# Patient Record
Sex: Female | Born: 1995 | Race: White | Hispanic: No | Smoking: Never smoker
Health system: Southern US, Community
[De-identification: ages and names within clinical notes are randomized; demographics above are authoritative.]

## PROBLEM LIST (undated history)

## (undated) DIAGNOSIS — IMO0001 Reserved for inherently not codable concepts without codable children: Secondary | ICD-10-CM

## (undated) DIAGNOSIS — M26609 Unspecified temporomandibular joint disorder, unspecified side: Secondary | ICD-10-CM

## (undated) DIAGNOSIS — K219 Gastro-esophageal reflux disease without esophagitis: Secondary | ICD-10-CM

## (undated) DIAGNOSIS — Z889 Allergy status to unspecified drugs, medicaments and biological substances status: Secondary | ICD-10-CM

## (undated) DIAGNOSIS — M419 Scoliosis, unspecified: Secondary | ICD-10-CM

## (undated) DIAGNOSIS — R51 Headache: Secondary | ICD-10-CM

## (undated) DIAGNOSIS — M940 Chondrocostal junction syndrome [Tietze]: Secondary | ICD-10-CM

## (undated) DIAGNOSIS — R519 Headache, unspecified: Secondary | ICD-10-CM

## (undated) HISTORY — DX: Headache: R51

## (undated) HISTORY — DX: Unspecified temporomandibular joint disorder, unspecified side: M26.609

## (undated) HISTORY — DX: Scoliosis, unspecified: M41.9

## (undated) HISTORY — DX: Chondrocostal junction syndrome (tietze): M94.0

## (undated) HISTORY — DX: Headache, unspecified: R51.9

---
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
## 2015-02-01 ENCOUNTER — Emergency Department (HOSPITAL_COMMUNITY)
Admission: EM | Admit: 2015-02-01 | Discharge: 2015-02-01 | Disposition: A | Discharge status: Home / Self Care | Payer: BLUE CROSS/BLUE SHIELD | Attending: Emergency Medicine | Admitting: Emergency Medicine

## 2015-02-01 ENCOUNTER — Encounter (HOSPITAL_COMMUNITY): Payer: Self-pay

## 2015-02-01 DIAGNOSIS — H11421 Conjunctival edema, right eye: Secondary | ICD-10-CM | POA: Diagnosis not present

## 2015-02-01 DIAGNOSIS — H11432 Conjunctival hyperemia, left eye: Secondary | ICD-10-CM

## 2015-02-01 DIAGNOSIS — M419 Scoliosis, unspecified: Secondary | ICD-10-CM | POA: Diagnosis not present

## 2015-02-01 DIAGNOSIS — H578 Other specified disorders of eye and adnexa: Secondary | ICD-10-CM | POA: Diagnosis present

## 2015-02-01 DIAGNOSIS — K219 Gastro-esophageal reflux disease without esophagitis: Secondary | ICD-10-CM | POA: Diagnosis not present

## 2015-02-01 MED ORDER — FLUORESCEIN SODIUM 1 MG OP STRP
1.0000 | ORAL_STRIP | Freq: Once | OPHTHALMIC | Status: AC
  Administered 2015-02-01: 1 via OPHTHALMIC

## 2015-02-01 MED ORDER — TETRACAINE HCL 0.5 % OP SOLN
1.0000 [drp] | Freq: Once | OPHTHALMIC | Status: AC
  Administered 2015-02-01: 1 [drp] via OPHTHALMIC
  Filled 2015-02-01: qty 2

## 2015-02-01 NOTE — ED Provider Notes (Signed)
CSN: [id number]     Arrival date & time 02/01/15  0134 History   First MD Initiated Contact with Patient 02/01/15 0144     Chief Complaint  Patient presents with  . Foreign Body in Eye    (Consider location/radiation/quality/duration/timing/severity/associated sxs/prior Treatment) HPI Comments: Patient presents with complaint of foreign body in her left eye. Patient states that she was taking out her contact lens earlier tonight and states that they contact became stuck in her eye. She repeatedly tried to remove it. She feels a ridge below the left iris and a 'bubble' when she pinches her eye as if she is trying to get the contact lens out. Patient states that her vision is similar to when she does not have a contact lens in that eye. No black spots or flashes. Her eyes irritated but there is no discharge. No pain with movement of either. No other foreign bodies into eye or other injuries reported. No treatments prior to arrival. Onset of symptoms acute. Course is constant. Nothing makes symptoms better or worse.  The history is provided by the patient.    Past Medical History  Diagnosis Date  . Multiple allergies   . Reflux   . Costochondritis   . Persistent headaches   . Scoliosis   . TMJ (temporomandibular joint disorder)    No past surgical history on file. Family History  Problem Relation Age of Onset  . Diabetes Paternal Grandmother   . Asthma Father   . Hypertension Father   . Kidney Stones Father   . Thyroid disease Mother    History  Substance Use Topics  . Smoking status: Never Smoker   . Smokeless tobacco: Never Used  . Alcohol Use: No   OB History    Gravida Para Term Preterm AB TAB SAB Ectopic Multiple Living       Review of Systems  Constitutional: Negative for fever.  HENT: Negative for congestion, ear pain and rhinorrhea.   Eyes: Positive for redness. Negative for photophobia, pain, discharge, itching and visual disturbance.   Gastrointestinal: Negative for nausea and vomiting.  Skin: Negative for rash.  Neurological: Negative for headaches.      Allergies  Erythromycin  Home Medications   Prior to Admission medications   Medication Sig Start Date End Date Taking? Authorizing Provider  omeprazole (PRILOSEC) 40 MG capsule Take 40 mg by mouth daily as needed (heart burn).    Yes Historical Provider, MD  sertraline (ZOLOFT) 100 MG tablet Take 100 mg by mouth daily.  11/04/14 11/04/15 Yes Historical Provider, MD  medroxyPROGESTERone (DEPO-PROVERA) 150 MG/ML injection Inject 1 mL (150 mg total) into the muscle once. Patient not taking: Reported on 02/01/2015 08/29/12   Silverio Lay, MD  traMADol (ULTRAM) 50 MG tablet Take 1 tablet (50 mg total) by mouth every 6 (six) hours as needed for pain. Patient not taking: Reported on 02/01/2015 06/28/12   Tia L Oliveri, PA-C   BP 119/78 mmHg  Pulse 87  Temp(Src) 97.8 F (36.6 C) (Oral)  Resp 18  Ht  (1.753 m)  Wt 116 lb (52.617 kg)  BMI 17.12 kg/m2  SpO2 100%   Physical Exam  Constitutional: She appears well-developed and well-nourished.  HENT:  Head: Normocephalic and atraumatic.  Right Ear: Tympanic membrane, external ear and ear canal normal.  Left Ear: Tympanic membrane, external ear and ear canal normal.  Nose: Nose normal. No mucosal edema.  Mouth/Throat: Uvula is  midline, oropharynx is clear and moist and mucous membranes are normal.  Eyes: EOM and lids are normal. Pupils are equal, round, and reactive to light. Lids are everted and swept, no foreign bodies found. Right eye exhibits no chemosis, no discharge, no exudate and no hordeolum. No foreign body present in the right eye. Left eye exhibits chemosis (mild, inferior to iris). Left eye exhibits no discharge, no exudate and no hordeolum. No foreign body present in the left eye. Right conjunctiva is not injected. Right conjunctiva has no hemorrhage. Left conjunctiva is injected (inferior sclera). Left  conjunctiva has no hemorrhage.  Neck: Normal range of motion. Neck supple.  Pulmonary/Chest: No respiratory distress.  Neurological: She is alert.  Skin: Skin is warm and dry.  Psychiatric: She has a normal mood and affect.  Nursing note and vitals reviewed.   ED Course  Procedures (including critical care time) Labs Review Labs Reviewed - No data to display  Imaging Review No results found.   EKG Interpretation None       1:50 AM Patient seen and examined. Work-up initiated. Medications ordered.   Vital signs reviewed and are as follows: BP 119/78 mmHg  Pulse 87  Temp(Src) 97.8 F (36.6 C) (Oral)  Resp 18  Ht 5\' 9"  (1.753 m)  Wt 116 lb (52.617 kg)  BMI 17.12 kg/m2  SpO2 100%  Two drops of tetracaine instilled into affected eye.   Fluorescein strip applied to affected eye. Wood's lamp used to assess for corneal abrasion. No corneal abrasion identified. No foreign bodies noted. No visible hyphema.   Patient tolerated procedure well without immediate complication.     Visual Acuity  Right Eye Distance: 20/20 Left Eye Distance: 20/70 Bilateral Distance: 20/15  Right Eye Near:   Left Eye Near:    Bilateral Near:     Note that visual acuity was performed with right contact in the eye in the left contact out of eye.   Ophthalmology referral given in case if no improvement in the next several days. Patient encouraged to return with change in vision, eye pain or drainage, fever, swelling around the eye. She verbalizes understanding and agrees with plan.   MDM   Final diagnoses:  Conjunctival injection, left   Patient with eye irritation after repeatedly trying to remove the contact lens in her left eye. I believe that the contact was probably dislodged and patient irritated the eye through repeatedly trying to remove it. No foreign bodies noted at this time. Lids everted and no contact lens found. No surrounding erythema, swelling, vision changes/loss suspicious  for orbital or periorbital cellulitis. No signs of iritis. No signs of glaucoma. No symptoms of retinal detachment. No ophthalmologic emergency suspected. Outpatient referral given in case of no improvement.     Renne CriglerJoshua Gurtej Noyola, PA-C 02/01/15 [id number]  Paula LibraJohn Molpus, MD 02/01/15 [phone]

## 2015-02-01 NOTE — ED Notes (Signed)
Pt presents with c/o contact stuck in her left eye since Friday night. Irritation to that eye, reports blurred vision as well.

## 2015-02-01 NOTE — Discharge Instructions (Signed)
Please read and follow all provided instructions.  Your diagnoses today include:  1. Conjunctival injection, left    Tests performed today include:  Visual acuity testing to check your vision  Fluorescein dye examination to look for scratches on your eye  Vital signs. See below for your results today.   Medications prescribed:   None  Take any prescribed medications only as directed.  Home care instructions:  Follow any educational materials contained in this packet. If you wear contact lenses, do not use them until your eye caregiver approves. See your caregiver or eye specialist as suggested for followup.   If you have an eye infection, wash your hands often as this is very contagious and is easily spread from person to person.   Follow-up instructions: Please follow-up with your primary care doctor OR the opthalmologist listed in the next 2-3 days for further evaluation of your symptoms.  Return instructions:   Please return to the Emergency Department if you experience worsening symptoms.   Please return immediately if you develop severe pain, pus drainage, new change in vision, or fever.  Please return if you have any other emergent concerns.  Additional Information:  Your vital signs today were: BP 119/78 mmHg   Pulse 87   Temp(Src) 97.8 F (36.6 C) (Oral)   Resp 18   Ht 5\' 9"  (1.753 m)   Wt 116 lb (52.617 kg)   BMI 17.12 kg/m2   SpO2 100%   LMP 01/24/2015 (Approximate) If your blood pressure (BP) was elevated above 135/85 this visit, please have this repeated by your doctor within one month. ---------------

## 2015-03-15 ENCOUNTER — Emergency Department (HOSPITAL_COMMUNITY)
Admission: EM | Admit: 2015-03-15 | Discharge: 2015-03-15 | Disposition: A | Discharge status: Home / Self Care | Payer: BLUE CROSS/BLUE SHIELD | Attending: Emergency Medicine | Admitting: Emergency Medicine

## 2015-03-15 ENCOUNTER — Encounter (HOSPITAL_COMMUNITY): Payer: Self-pay | Admitting: Emergency Medicine

## 2015-03-15 DIAGNOSIS — J0191 Acute recurrent sinusitis, unspecified: Secondary | ICD-10-CM | POA: Diagnosis not present

## 2015-03-15 DIAGNOSIS — J302 Other seasonal allergic rhinitis: Secondary | ICD-10-CM | POA: Diagnosis not present

## 2015-03-15 DIAGNOSIS — K219 Gastro-esophageal reflux disease without esophagitis: Secondary | ICD-10-CM | POA: Insufficient documentation

## 2015-03-15 DIAGNOSIS — L03116 Cellulitis of left lower limb: Secondary | ICD-10-CM | POA: Diagnosis not present

## 2015-03-15 DIAGNOSIS — M419 Scoliosis, unspecified: Secondary | ICD-10-CM | POA: Diagnosis not present

## 2015-03-15 DIAGNOSIS — R21 Rash and other nonspecific skin eruption: Secondary | ICD-10-CM | POA: Diagnosis present

## 2015-03-15 MED ORDER — HYDROCODONE-ACETAMINOPHEN 5-325 MG PO TABS: 1.0000 | ORAL_TABLET | Freq: Four times a day (QID) | ORAL | Status: AC | PRN

## 2015-03-15 MED ORDER — CEPHALEXIN 500 MG PO CAPS: ORAL_CAPSULE | ORAL | Status: DC

## 2015-03-15 MED ORDER — SULFAMETHOXAZOLE-TRIMETHOPRIM 800-160 MG PO TABS: 1.0000 | ORAL_TABLET | Freq: Two times a day (BID) | ORAL | Status: DC

## 2015-03-15 MED ORDER — NAPROXEN 500 MG PO TABS: 500.0000 mg | ORAL_TABLET | Freq: Two times a day (BID) | ORAL | Status: AC | PRN

## 2015-03-15 NOTE — ED Notes (Signed)
Pt states 1 month ago began with an itching rash to lower legs, states they occasionally look like small pimples. States she believes it could be stress related. Now has an area to the back of the left calf that is red with central head to area. Complaining of sinus congestion with yellow mucus x 2 days as well with occasional fever/hot flashes. Took no meds prior to coming in today, temp 98.6

## 2015-03-15 NOTE — ED Provider Notes (Signed)
CSN: [id number]     Arrival date & time 03/15/15  1458 History  This chart was scribed for non-physician provider Kort Stettler Camprubi- Soms, PA-C, working with Marily Memos, MD by Phillis Haggis, ED Scribe. This patient was seen in room WTR8/WTR8 and patient care was started at 3:10 PM.    Chief Complaint  Patient presents with  . Rash  . Sinus Problem   Patient is a 19 y.o. female presenting with abscess. The history is provided by the patient. No language interpreter was used.  Abscess Location:  Leg Leg abscess location:  L lower leg Abscess quality: induration, painful, redness and warmth   Abscess quality: not draining and no fluctuance   Red streaking: no   Duration:  3 days Progression:  Unchanged Pain details:    Quality:  Throbbing   Severity:  Moderate   Duration:  3 days   Timing:  Constant   Progression:  Unchanged Chronicity:  New Context: skin injury (rash)   Relieved by:  None tried Exacerbated by: ambulation. Ineffective treatments:  None tried Associated symptoms: fever (Tmax 100)   Associated symptoms: no headaches, no nausea and no vomiting    HPI Comments: Arnelle Nale is a 19 y.o. female with a PMHx of sinus infections, grass and mold allergies, and costochondritis, who presents to the ED with multiple complaints. Her primary complaint is an indurated, warm, red, swollen area on the posterior L calf that began 3 days ago. She states that for the last 1 month she's had a bilateral lower leg rash, left worse than right, which is itchy and when she scratches it the small areas which she describes as pimples open up. Pt reports the pain in the area on the L leg is a 8/10, constant, throbbing, nonradiating pain, worse with standing, with no treatments tried PTA. Secondarily she complaints of sinus congestion, yellow nasal drainage, intermittent fever tmax 100 F, and postnasal drip.  Denies chills, ear pain or draining, sore throat, trouble swallowing, CP, SOB, cough,  wheezing, abd pain, N/V/D/C, hematuria, dysuria, myalgias, arthralgias, back pain, neck pain, red streaking, or drainage from skin, headache, numbness, tingling, or weakness. Has been using new soaps, detergents, and deodorants to try to help the rash and has not had relief. States that she has used friends' razors occasionally and changes her razor blade heads every 2 weeks. Denies recent tick or other insect bites, recent travel, plant or animal contact, or any other exposures to produce the rash. Pt reports sensitivity to erythromycin.     Past Medical History  Diagnosis Date  . Multiple allergies   . Reflux   . Costochondritis   . Persistent headaches   . Scoliosis   . TMJ (temporomandibular joint disorder)    No past surgical history on file. Family History  Problem Relation Age of Onset  . Diabetes Paternal Grandmother   . Asthma Father   . Hypertension Father   . Kidney Stones Father   . Thyroid disease Mother    Social History  Substance Use Topics  . Smoking status: Never Smoker   . Smokeless tobacco: Never Used  . Alcohol Use: No   OB History    Gravida Para Term Preterm AB TAB SAB Ectopic Multiple Living       Review of Systems  Constitutional: Positive for fever (Tmax 100). Negative for chills.  HENT: Positive for congestion, postnasal drip, rhinorrhea (yellow) and sinus pressure. Negative for  ear discharge, ear pain, sore throat and trouble swallowing.   Respiratory: Negative for cough, shortness of breath and wheezing.   Cardiovascular: Negative for chest pain.  Gastrointestinal: Negative for nausea, vomiting, abdominal pain, diarrhea and constipation.  Genitourinary: Negative for dysuria and hematuria.  Musculoskeletal: Negative for myalgias, back pain, arthralgias and neck pain.  Skin: Positive for color change and rash.       No red streaking  Allergic/Immunologic: Positive for environmental allergies. Negative for immunocompromised  state.  Neurological: Negative for weakness, numbness and headaches.   A complete 10 system review of systems was obtained and all systems are negative except as noted in the HPI and PMH.   Allergies  Erythromycin  Home Medications   Prior to Admission medications   Medication Sig Start Date End Date Taking? Authorizing Provider  medroxyPROGESTERone (DEPO-PROVERA) 150 MG/ML injection Inject 1 mL (150 mg total) into the muscle once. Patient not taking: Reported on 02/01/2015 08/29/12   Silverio Lay, MD  omeprazole (PRILOSEC) 40 MG capsule Take 40 mg by mouth daily as needed (heart burn).     Historical Provider, MD  sertraline (ZOLOFT) 100 MG tablet Take 100 mg by mouth daily.  11/04/14 11/04/15  Historical Provider, MD  traMADol (ULTRAM) 50 MG tablet Take 1 tablet (50 mg total) by mouth every 6 (six) hours as needed for pain. Patient not taking: Reported on 02/01/2015 06/28/12   Tia L Oliveri, PA-C   BP 126/75 mmHg  Pulse 101  Temp(Src) 98.6 F (37 C) (Oral)  Resp 18  SpO2 99%  Physical Exam  Constitutional: She is oriented to person, place, and time. Vital signs are normal. She appears well-developed and well-nourished.  Non-toxic appearance. No distress.  Afebrile, nontoxic, NAD  HENT:  Head: Normocephalic and atraumatic.  Right Ear: Hearing, tympanic membrane, external ear and ear canal normal.  Left Ear: Hearing, tympanic membrane, external ear and ear canal normal.  Nose: Mucosal edema present. Right sinus exhibits maxillary sinus tenderness. Right sinus exhibits no frontal sinus tenderness. Left sinus exhibits maxillary sinus tenderness. Left sinus exhibits no frontal sinus tenderness.  Mouth/Throat: Uvula is midline, oropharynx is clear and moist and mucous membranes are normal. No trismus in the jaw. No uvula swelling.  Ears are clear bilaterally. Nose with mild nasal mucosa edema. Bilateral maxillary sinus TTP.  Oropharynx clear and moist, without uvular swelling or deviation, no  trismus or drooling, no tonsillar swelling or erythema, no exudates.   Eyes: Conjunctivae and EOM are normal. Right eye exhibits no discharge. Left eye exhibits no discharge.  Neck: Normal range of motion. Neck supple.  Cardiovascular: Normal rate and intact distal pulses.   Pulmonary/Chest: Effort normal. No respiratory distress.  Abdominal: Normal appearance. She exhibits no distension.  Musculoskeletal: Normal range of motion.  Neurological: She is alert and oriented to person, place, and time. She has normal strength. No sensory deficit.  Skin: Skin is warm, dry and intact. Rash noted. There is erythema.     Approximately 3 cm area of induration located to the posterior left calf, erythematous and warm to the touch with a central pinpoint head/opening, with mild TTP to the area, no drainage or fluctuance. No LE edema. Multiple other lesions to bilateral legs which appear like insect bites. No red streaking.   Psychiatric: She has a normal mood and affect. Her behavior is normal.  Nursing note and vitals reviewed.   ED Course  Procedures (including critical care time) DIAGNOSTIC STUDIES: Oxygen Saturation is 99% on RA,  normal by my interpretation.    COORDINATION OF CARE: 3:17 PM-Discussed treatment plan which includes anti-biotics and hydrocortisone cream, heat compresses, follow up with PCP with pt at bedside and pt agreed to plan.   Labs Review Labs Reviewed - No data to display  Imaging Review No results found.    EKG Interpretation None      MDM   Final diagnoses:  Cellulitis of left lower extremity  Acute recurrent sinusitis, unspecified location  Rash    19 y.o. female here with multiple complaints. Primary complaint is cellulitic area to posterior L leg, which resulted from a rash she's had x1 month. No recent changes in meds/etc, no exposures, no insect/tick bites. Unclear etiology of rash therefore pt will need to f/up with derm/PCP, but area on leg today  appears to be cellulitis, not yet a fluctuant abscess. Will start on abx, give pain meds, and have her f/up with PCP in 2 days for recheck. Secondary complaint is 2 days of sinus congestion and yellow nasal discharge with occasional low-grade temp. Afebrile here. Mild nasal edema and maxillary sinus tenderness. Abx should cover for sinusitis, discussed other OTC med options to help as well. Will have her f/up with PCP. I explained the diagnosis and have given explicit precautions to return to the ER including for any other new or worsening symptoms. The patient understands and accepts the medical plan as it's been dictated and I have answered their questions. Discharge instructions concerning home care and prescriptions have been given. The patient is STABLE and is discharged to home in good condition.   I personally performed the services described in this documentation, which was scribed in my presence. The recorded information has been reviewed and is accurate.  BP 126/75 mmHg  Pulse 101  Temp(Src) 98.6 F (37 C) (Oral)  Resp 18  SpO2 99%  Meds ordered this encounter  Medications  . sulfamethoxazole-trimethoprim (BACTRIM DS,SEPTRA DS) 800-160 MG per tablet    Sig: Take 1 tablet by mouth 2 (two) times daily.    Dispense:  14 tablet    Refill:  0    Order Specific Question:  Supervising Provider    Answer:  MILLER, BRIAN [[id number]]  . cephALEXin (KEFLEX) 500 MG capsule    Sig: 2 caps po bid x 7 days    Dispense:  28 capsule    Refill:  0    Order Specific Question:  Supervising Provider    Answer:  MILLER, BRIAN [[id number]]  . HYDROcodone-acetaminophen (NORCO) 5-325 MG per tablet    Sig: Take 1 tablet by mouth every 6 (six) hours as needed for severe pain.    Dispense:  6 tablet    Refill:  0    Order Specific Question:  Supervising Provider    Answer:  MILLER, BRIAN [[id number]]  . naproxen (NAPROSYN) 500 MG tablet    Sig: Take 1 tablet (500 mg total) by mouth 2 (two) times daily as needed for  mild pain, moderate pain or headache (TAKE WITH MEALS.).    Dispense:  20 tablet    Refill:  0    Order Specific Question:  Supervising Provider    Answer:  Eusebio Friendly      Tiandre Teall Camprubi-Soms, PA-C 03/15/15 1542  Marily Memos, MD 03/17/15 [phone]

## 2015-03-15 NOTE — Discharge Instructions (Signed)
Keep area on leg clean and dry. Apply warm compresses to affected area throughout the day. Take antibiotics until it is finished. Take naprosyn and norco as directed, as needed for pain but do not drive or operate machinery with pain medication use. Followup with [facility]Primary Care doctor in 2 days for wound recheck and ongoing evaluation of the rash. Monitor area for signs of infection to include, but not limited to: increasing pain, redness, drainage/pus, or swelling. Continue to stay well-hydrated. Use Mucinex for cough suppression/expectoration of mucus. Use netipot and flonase to help with nasal congestion. May consider over-the-counter Benadryl or other antihistamine to decrease secretions and for watery itchy eyes. Return to emergency department for emergent changing or worsening symptoms.    Cellulitis Cellulitis is an infection of the skin and the tissue under the skin. The infected area is usually red and tender. This happens most often in the arms and lower legs. HOME CARE   Take your antibiotic medicine as told. Finish the medicine even if you start to feel better.  Keep the infected arm or leg raised (elevated).  Put a warm cloth on the area up to 4 times per day.  Only take medicines as told by your doctor.  Keep all doctor visits as told. GET HELP IF:  You see red streaks on the skin coming from the infected area.  Your red area gets bigger or turns a dark color.  Your bone or joint under the infected area is painful after the skin heals.  Your infection comes back in the same area or different area.  You have a puffy (swollen) bump in the infected area.  You have new symptoms.  You have a fever. GET HELP RIGHT AWAY IF:   You feel very sleepy.  You throw up (vomit) or have watery poop (diarrhea).  You feel sick and have muscle aches and pains. MAKE SURE YOU:   Understand these instructions.  Will watch your condition.  Will get help right  away if you are not doing well or get worse. Document Released: 01/04/2008 Document Revised: 12/02/2013 Document Reviewed: 10/03/2011 [facility] Patient Information 2015 [place], [place]. This information is not intended to replace advice given to you by your health care provider. Make sure you discuss any questions you have with your health care provider.  Abscess An abscess (boil or furuncle) is an infected area on or under the skin. This area is filled with yellowish-white fluid (pus) and other material (debris). HOME CARE   Only take medicines as told by your doctor.  If you were given antibiotic medicine, take it as directed. Finish the medicine even if you start to feel better.  If gauze is used, follow your doctor's directions for changing the gauze.  To avoid spreading the infection:  Keep your abscess covered with a bandage.  Wash your hands well.  Do not share personal care items, towels, or whirlpools with others.  Avoid skin contact with others.  Keep your skin and clothes clean around the abscess.  Keep all doctor visits as told. GET HELP RIGHT AWAY IF:   You have more pain, puffiness (swelling), or redness in the wound site.  You have more fluid or blood coming from the wound site.  You have muscle aches, chills, or you feel sick.  You have a fever. MAKE SURE YOU:   Understand these instructions.  Will watch your condition.  Will get help right away if you are not doing well or get  worse. Document Released: 01/04/2008 Document Revised: 01/17/2012 Document Reviewed: 09/30/2011 [facility] Patient Information 2015 [place], [place]. This information is not intended to replace advice given to you by your health care provider. Make sure you discuss any questions you have with your health care provider.  Sinusitis Sinusitis is redness, soreness, and puffiness (inflammation) of the air pockets in the bones of your face (sinuses). The redness, soreness, and puffiness can  cause air and mucus to get trapped in your sinuses. This can allow germs to grow and cause an infection.  HOME CARE   Drink enough fluids to keep your pee (urine) clear or pale yellow.  Use a humidifier in your home.  Run a hot shower to create steam in the bathroom. Sit in the bathroom with the door closed. Breathe in the steam 3-4 times a day.  Put a warm, moist washcloth on your face 3-4 times a day, or as told by your doctor.  Use salt water sprays (saline sprays) to wet the thick fluid in your nose. This can help the sinuses drain.  Only take medicine as told by your doctor. GET HELP RIGHT AWAY IF:   Your pain gets worse.  You have very bad headaches.  You are sick to your stomach (nauseous).  You throw up (vomit).  You are very sleepy (drowsy) all the time.  Your face is puffy (swollen).  Your vision changes.  You have a stiff neck.  You have trouble breathing. MAKE SURE YOU:   Understand these instructions.  Will watch your condition.  Will get help right away if you are not doing well or get worse. Document Released: 01/04/2008 Document Revised: 04/11/2012 Document Reviewed: 02/21/2012 [facility] Patient Information 2015 [place], [place]. This information is not intended to replace advice given to you by your health care provider. Make sure you discuss any questions you have with your health care provider.

## 2015-05-08 ENCOUNTER — Encounter (HOSPITAL_COMMUNITY): Payer: Self-pay | Admitting: Emergency Medicine

## 2015-05-08 ENCOUNTER — Emergency Department (HOSPITAL_COMMUNITY)
Admission: EM | Admit: 2015-05-08 | Discharge: 2015-05-08 | Disposition: A | Discharge status: Home / Self Care | Payer: BLUE CROSS/BLUE SHIELD | Attending: Emergency Medicine | Admitting: Emergency Medicine

## 2015-05-08 DIAGNOSIS — L02416 Cutaneous abscess of left lower limb: Secondary | ICD-10-CM | POA: Insufficient documentation

## 2015-05-08 DIAGNOSIS — L0291 Cutaneous abscess, unspecified: Secondary | ICD-10-CM

## 2015-05-08 DIAGNOSIS — Z79899 Other long term (current) drug therapy: Secondary | ICD-10-CM | POA: Insufficient documentation

## 2015-05-08 DIAGNOSIS — Z8719 Personal history of other diseases of the digestive system: Secondary | ICD-10-CM | POA: Insufficient documentation

## 2015-05-08 DIAGNOSIS — L0390 Cellulitis, unspecified: Secondary | ICD-10-CM

## 2015-05-08 DIAGNOSIS — Z8739 Personal history of other diseases of the musculoskeletal system and connective tissue: Secondary | ICD-10-CM | POA: Insufficient documentation

## 2015-05-08 DIAGNOSIS — Z792 Long term (current) use of antibiotics: Secondary | ICD-10-CM | POA: Diagnosis not present

## 2015-05-08 MED ORDER — LIDOCAINE-EPINEPHRINE (PF) 1 %-1:200000 IJ SOLN
20.0000 mL | Freq: Once | INTRAMUSCULAR | Status: AC
  Administered 2015-05-08: 20 mL
  Filled 2015-05-08: qty 30

## 2015-05-08 MED ORDER — CLINDAMYCIN HCL 300 MG PO CAPS
300.0000 mg | ORAL_CAPSULE | Freq: Once | ORAL | Status: AC
  Administered 2015-05-08: 300 mg via ORAL
  Filled 2015-05-08: qty 1

## 2015-05-08 MED ORDER — SULFAMETHOXAZOLE-TRIMETHOPRIM 800-160 MG PO TABS: 1.0000 | ORAL_TABLET | Freq: Two times a day (BID) | ORAL | Status: AC

## 2015-05-08 MED ORDER — TRAMADOL HCL 50 MG PO TABS
50.0000 mg | ORAL_TABLET | Freq: Four times a day (QID) | ORAL | Status: AC | PRN
Start: 2015-05-08 — End: ?

## 2015-05-08 MED ORDER — CEPHALEXIN 500 MG PO CAPS: 500.0000 mg | ORAL_CAPSULE | Freq: Two times a day (BID) | ORAL | Status: AC

## 2015-05-08 NOTE — ED Notes (Signed)
PA at bedside for I & D.

## 2015-05-08 NOTE — Discharge Instructions (Signed)
If you see worsening signs of infection (warmth, redness, tenderness, pus, sharp increase in pain, fever, red streaking) immediately return to the emergency department. ° °Please follow with your primary care doctor in the next 2 days for a check-up. They must obtain records for further management.  ° °Do not hesitate to return to the Emergency Department for any new, worsening or concerning symptoms.  ° ° °Cellulitis °Cellulitis is an infection of the skin and the tissue beneath it. The infected area is usually red and tender. Cellulitis occurs most often in the arms and lower legs.  °CAUSES  °Cellulitis is caused by bacteria that enter the skin through cracks or cuts in the skin. The most common types of bacteria that cause cellulitis are staphylococci and streptococci. °SIGNS AND SYMPTOMS  °· Redness and warmth. °· Swelling. °· Tenderness or pain. °· Fever. °DIAGNOSIS  °Your health care provider can usually determine what is wrong based on a physical exam. Blood tests may also be done. °TREATMENT  °Treatment usually involves taking an antibiotic medicine. °HOME CARE INSTRUCTIONS  °· Take your antibiotic medicine as directed by your health care provider. Finish the antibiotic even if you start to feel better. °· Keep the infected arm or leg elevated to reduce swelling. °· Apply a warm cloth to the affected area up to 4 times per day to relieve pain. °· Take medicines only as directed by your health care provider. °· Keep all follow-up visits as directed by your health care provider. °SEEK MEDICAL CARE IF:  °· You notice red streaks coming from the infected area. °· Your red area gets larger or turns dark in color. °· Your bone or joint underneath the infected area becomes painful after the skin has healed. °· Your infection returns in the same area or another area. °· You notice a swollen bump in the infected area. °· You develop new symptoms. °· You have a fever. °SEEK IMMEDIATE MEDICAL CARE IF:  °· You feel very  sleepy. °· You develop vomiting or diarrhea. °· You have a general ill feeling (malaise) with muscle aches and pains. °  °This information is not intended to replace advice given to you by your health care provider. Make sure you discuss any questions you have with your health care provider. °  °Document Released: 04/27/2005 Document Revised: 04/08/2015 Document Reviewed: 10/03/2011 °[employer]vier Interactive Patient Education ©2016 [employer] ° °

## 2015-05-08 NOTE — ED Notes (Signed)
Pt has weeping abscess on lt ankle.  States that she gets a rash when she is stressed and one of the bumps turned into an abscess.  States that this has happened before.  Redness and swelling extending down into ankle/foot.

## 2015-05-08 NOTE — ED Provider Notes (Signed)
History  By signing my name below, I, Christina Holt, attest that this documentation has been prepared under the direction and in the presence of [employer], PA-C. Electronically Signed: Karle Holt, ED Scribe. 05/08/2015. 7:08 PM.  Chief Complaint  Patient presents with  . Abscess   HPI  HPI Comments:  Christina Holt is a 19 y.o. female who presents to the Emergency Department complaining of a red, moderately painful abscess to the left lateral lower extremity that began two days ago. She states the area started out as a small rash that she gets when she is stressed out about things and one of the bumps turned into the abscess.She reports associated swelling of the area and mild purulent drainage. She has not done anything to treat the area. Touching the area increases the pain. She denies alleviating factors. She denies fever, chills, nausea or vomiting.   Past Medical History  Diagnosis Date  . Multiple allergies   . Reflux   . Costochondritis   . Persistent headaches   . Scoliosis   . TMJ (temporomandibular joint disorder)    No past surgical history on file. Family History  Problem Relation Age of Onset  . Diabetes Paternal Grandmother   . Asthma Father   . Hypertension Father   . Kidney Stones Father   . Thyroid disease Mother    Social History  Substance Use Topics  . Smoking status: Never Smoker   . Smokeless tobacco: Never Used  . Alcohol Use: No   OB History    Gravida Para Term Preterm AB TAB SAB Ectopic Multiple Living       Review of Systems A complete 10 system review of systems was obtained and all systems are negative except as noted in the HPI and PMH.   Allergies  Erythromycin  Home Medications   Prior to Admission medications   Medication Sig Start Date End Date Taking? Authorizing Provider  cephALEXin (KEFLEX) 500 MG capsule 2 caps po bid x 7 days 03/15/15   Mercedes Camprubi-Soms, PA-C   HYDROcodone-acetaminophen (NORCO) 5-325 MG per tablet Take 1 tablet by mouth every 6 (six) hours as needed for severe pain. 03/15/15   Mercedes Camprubi-Soms, PA-C  medroxyPROGESTERone (DEPO-PROVERA) 150 MG/ML injection Inject 1 mL (150 mg total) into the muscle once. Patient not taking: Reported on 02/01/2015 08/29/12   Silverio Lay, MD  naproxen (NAPROSYN) 500 MG tablet Take 1 tablet (500 mg total) by mouth 2 (two) times daily as needed for mild pain, moderate pain or headache (TAKE WITH MEALS.). 03/15/15   Mercedes Camprubi-Soms, PA-C  omeprazole (PRILOSEC) 40 MG capsule Take 40 mg by mouth daily as needed (heart burn).     Historical Provider, MD  sertraline (ZOLOFT) 100 MG tablet Take 100 mg by mouth daily.  11/04/14 11/04/15  Historical Provider, MD  sulfamethoxazole-trimethoprim (BACTRIM DS,SEPTRA DS) 800-160 MG per tablet Take 1 tablet by mouth 2 (two) times daily. 03/15/15   Mercedes Camprubi-Soms, PA-C  traMADol (ULTRAM) 50 MG tablet Take 1 tablet (50 mg total) by mouth every 6 (six) hours as needed for pain. Patient not taking: Reported on 02/01/2015 06/28/12   Riki Sheer, PA-C   Triage Vitals: BP 119/68 mmHg  Pulse 94  Temp(Src) 98.1 F (36.7 C) (Oral)  Resp 18  SpO2 100%  LMP 05/04/2015 (Exact Date) Physical Exam  Constitutional: She is oriented to person, place, and time. She appears well-developed and well-nourished.  HENT:  Head: Normocephalic and atraumatic.  Eyes: EOM are normal.  Neck: Normal range of motion.  Cardiovascular: Normal rate.   Pulmonary/Chest: Effort normal.  Musculoskeletal: Normal range of motion.  Neurological: She is alert and oriented to person, place, and time.  Skin: Skin is warm and dry.  10 cm of cellulitis to left lateral distal leg with central fluctuance and active discharge.  Psychiatric: She has a normal mood and affect. Her behavior is normal.  Nursing note and vitals reviewed.   ED Course  Procedures (including critical care  time) DIAGNOSTIC STUDIES: Oxygen Saturation is 100% on RA, normal by my interpretation.   COORDINATION OF CARE: 6:43 PM- Will I & D area and give first dose of Clindamycin prior to discharge. Will prescribe Clindamycin and pain medication to go home with. Pt verbalizes understanding and agrees to plan.  INCISION AND DRAINAGE PROCEDURE NOTE: Patient identification was confirmed and verbal consent was obtained. This procedure was performed by Wynetta Emery, PA-C at 6:54 PM. Site: right lateral distal lower extremity Sterile procedures observed Needle size: 25 G Anesthetic used (type and amt): Lidocaine 1% with Epinephrine (1 mLs) Blade size: 11 Drainage: scant, bloody Complexity: Complex Packing used: n/a Site anesthetized, incision made over site, wound drained and explored loculations, rinsed with copious amounts of normal saline, covered with dry, sterile dressing. Pt tolerated procedure well without complications. Instructions for care discussed verbally and pt provided with additional written instructions for homecare and f/u.  Medications  lidocaine-EPINEPHrine (XYLOCAINE-EPINEPHrine) 1 %-1:200000 (PF) injection 20 mL (20 mLs Other Given 05/08/15 1856)  clindamycin (CLEOCIN) capsule 300 mg (300 mg Oral Given 05/08/15 1856)    MDM   Final diagnoses:  Abscess and cellulitis    Filed Vitals:   05/08/15 1724  BP: 119/68  Pulse: 94  Temp: 98.1 F (36.7 C)  TempSrc: Oral  Resp: 18  SpO2: 100%    Christina Holt is a pleasant 19 y.o. female presenting with abscess and cellulitis to lower extremity, I and D performed with a scant amount of bloody and purulent fluid. Considering surrounding cellulitis patient will be started on Keflex and Bactrim. We've had an extensive discussion of wound care and return precautions and patient verbalizes her understanding.  Evaluation does not show pathology that would require ongoing emergent intervention or inpatient treatment. Pt is  hemodynamically stable and mentating appropriately. Discussed findings and plan with patient/guardian, who agrees with care plan. All questions answered. Return precautions discussed and outpatient follow up given.    I personally performed the services described in this documentation, which was scribed in my presence. The recorded information has been reviewed and is accurate.     Wynetta Emery, PA-C 05/09/15 1116  Laurence Spates, MD 05/09/15 [phone]

## 2019-12-11 ENCOUNTER — Ambulatory Visit: Payer: Self-pay | Attending: Internal Medicine

## 2020-06-22 ENCOUNTER — Emergency Department (HOSPITAL_BASED_OUTPATIENT_CLINIC_OR_DEPARTMENT_OTHER): Payer: 59

## 2020-06-22 ENCOUNTER — Emergency Department (HOSPITAL_BASED_OUTPATIENT_CLINIC_OR_DEPARTMENT_OTHER)
Admission: EM | Admit: 2020-06-22 | Discharge: 2020-06-22 | Disposition: A | Discharge status: Home / Self Care | Payer: 59 | Attending: Emergency Medicine | Admitting: Emergency Medicine

## 2020-06-22 ENCOUNTER — Other Ambulatory Visit: Payer: Self-pay

## 2020-06-22 ENCOUNTER — Encounter (HOSPITAL_BASED_OUTPATIENT_CLINIC_OR_DEPARTMENT_OTHER): Payer: Self-pay

## 2020-06-22 DIAGNOSIS — T148XXA Other injury of unspecified body region, initial encounter: Secondary | ICD-10-CM

## 2020-06-22 DIAGNOSIS — S39012A Strain of muscle, fascia and tendon of lower back, initial encounter: Secondary | ICD-10-CM | POA: Diagnosis not present

## 2020-06-22 DIAGNOSIS — Z3A08 8 weeks gestation of pregnancy: Secondary | ICD-10-CM | POA: Diagnosis not present

## 2020-06-22 DIAGNOSIS — R202 Paresthesia of skin: Secondary | ICD-10-CM | POA: Diagnosis not present

## 2020-06-22 DIAGNOSIS — Z79899 Other long term (current) drug therapy: Secondary | ICD-10-CM | POA: Diagnosis not present

## 2020-06-22 DIAGNOSIS — Y33XXXA Other specified events, undetermined intent, initial encounter: Secondary | ICD-10-CM | POA: Diagnosis not present

## 2020-06-22 DIAGNOSIS — M79605 Pain in left leg: Secondary | ICD-10-CM | POA: Diagnosis present

## 2020-06-22 NOTE — ED Provider Notes (Signed)
[facility] EMERGENCY DEPARTMENT Provider Note   CSN: [id number] Arrival date & time: 06/22/20  1438     History Chief Complaint  Patient presents with  . Leg Pain    Christina Holt is a 24 y.o. female who is [redacted] weeks pregnant, who presents with 5 days of worsening medial left thigh pain.  She states she woke up with it approximately 5 days ago, and it has become progressively more severe over the last 5 days.  She endorses associated tingling in the left foot, denies weakness, numbness.  She is able to ambulate independently.  Small splotchy area in her left distal medial thigh 2 days ago, is mildly tender to palpation.    Patient denies injury to the leg, denies trauma, new activity.  She states that she has been quite sedentary the last week, as she has been experiencing morning sickness for the last several weeks.  She was seen at urgent care earlier today and referred to the emergency department for evaluation for DVT.  I personally reviewed the patient's medical records.  She is history of anxiety, depression.  G1P0.    HPI     History reviewed. No pertinent past medical history.  There are no problems to display for this patient.   History reviewed. No pertinent surgical history.   OB History    Gravida  1   Para      Term      Preterm      AB      Living        SAB      TAB      Ectopic      Multiple      Live Births              History reviewed. No pertinent family history.  Social History   Tobacco Use  . Smoking status: Never Smoker  Substance Use Topics  . Alcohol use: Not Currently  . Drug use: Never    Home Medications Prior to Admission medications   Medication Sig Start Date End Date Taking? Authorizing Provider  FLUoxetine (PROZAC) 20 MG capsule Take 20 mg by mouth every evening.   Yes [provider]  traZODone (DESYREL) 100 MG tablet Take 100 mg by mouth at bedtime. 04/28/20  Yes [provider]    Allergies    Azithromycin  Review of Systems   Review of Systems  Constitutional: Negative for activity change, chills, fatigue and fever.  HENT: Negative.   Respiratory: Negative for cough, chest tightness, shortness of breath and wheezing.   Cardiovascular: Negative for chest pain, palpitations and leg swelling.  Gastrointestinal: Negative for abdominal pain, nausea and vomiting.  Genitourinary: Negative.   Musculoskeletal: Positive for myalgias.       Left leg  Skin: Negative.   Neurological: Negative.   Hematological:       Pregnant    Physical Exam Updated Vital Signs BP (!) 101/58 (BP Location: Right Arm)   Pulse (!) 56   Temp 98.6 F (37 C) (Oral)   Resp 16   Ht 5\' 9"  (1.753 m)   Wt 61.2 kg   SpO2 100%   BMI 19.94 kg/m   Physical Exam Vitals and nursing note reviewed.  Constitutional:      Appearance: She is normal weight.  HENT:     Head: Normocephalic and atraumatic.     Nose: Nose normal.     Mouth/Throat:     Mouth:  Mucous membranes are moist.     Pharynx: Oropharynx is clear. No oropharyngeal exudate or posterior oropharyngeal erythema.  Eyes:     General:        Right eye: No discharge.        Left eye: No discharge.     Conjunctiva/sclera: Conjunctivae normal.     Pupils: Pupils are equal, round, and reactive to light.  Neck:     Trachea: Trachea and phonation normal.  Cardiovascular:     Rate and Rhythm: Normal rate and regular rhythm.     Pulses: Normal pulses.          Radial pulses are 2+ on the right side and 2+ on the left side.       Dorsalis pedis pulses are 2+ on the right side and 2+ on the left side.     Heart sounds: Normal heart sounds. No murmur heard.   Pulmonary:     Effort: Pulmonary effort is normal. No respiratory distress.     Breath sounds: Normal breath sounds. No wheezing or rales.  Abdominal:     General: Bowel sounds are normal. There is no distension.     Palpations: Abdomen is soft.     Tenderness: There  is no abdominal tenderness.  Musculoskeletal:        General: No deformity.     Cervical back: Neck supple. No rigidity, spasms, tenderness or bony tenderness.     Thoracic back: No spasms, tenderness or bony tenderness.     Lumbar back: No spasms, tenderness or bony tenderness.     Right hip: Normal.     Right upper leg: Normal.     Left upper leg: Tenderness present. No edema.     Right knee: Normal.     Left knee: Normal.     Right lower leg: Normal. No edema.     Left lower leg: Normal. No edema.     Right ankle: Normal.     Right Achilles Tendon: Normal.     Left ankle: Normal.     Left Achilles Tendon: Normal.     Right foot: Normal.     Left foot: Normal.       Legs:     Comments: No calf TTP, no calf edema or TTP.   Lymphadenopathy:     Cervical: No cervical adenopathy.  Skin:    General: Skin is warm and dry.     Capillary Refill: Capillary refill takes less than 2 seconds.  Neurological:     General: No focal deficit present.     Mental Status: She is alert and oriented to person, place, and time. Mental status is at baseline.  Psychiatric:        Mood and Affect: Mood normal.     ED Results / Procedures / Treatments   Labs (all labs ordered are listed, but only abnormal results are displayed) Labs Reviewed - No data to display  EKG None  Radiology US Venous Img Lower Unilateral Left  Result Date: 06/22/2020 CLINICAL DATA:  Left leg pain for several days, early pregnancy EXAM: LEFT LOWER EXTREMITY VENOUS DOPPLER ULTRASOUND TECHNIQUE: Gray-scale sonography with graded compression, as well as color Doppler and duplex ultrasound were performed to evaluate the lower extremity deep venous systems from the level of the common femoral vein and including the common femoral, femoral, profunda femoral, popliteal and calf veins including the posterior tibial, peroneal and gastrocnemius veins when visible. The superficial great saphenous vein was also interrogated.  Spectral Doppler was utilized to evaluate flow at rest and with distal augmentation maneuvers in the common femoral, femoral and popliteal veins. COMPARISON:  None. FINDINGS: Contralateral Common Femoral Vein: Respiratory phasicity is normal and symmetric with the symptomatic side. No evidence of thrombus. Normal compressibility. Common Femoral Vein: No evidence of thrombus. Normal compressibility, respiratory phasicity and response to augmentation. Saphenofemoral Junction: No evidence of thrombus. Normal compressibility and flow on color Doppler imaging. Profunda Femoral Vein: No evidence of thrombus. Normal compressibility and flow on color Doppler imaging. Femoral Vein: No evidence of thrombus. Normal compressibility, respiratory phasicity and response to augmentation. Popliteal Vein: No evidence of thrombus. Normal compressibility, respiratory phasicity and response to augmentation. Calf Veins: No evidence of thrombus. Normal compressibility and flow on color Doppler imaging. Superficial Great Saphenous Vein: No evidence of thrombus. Normal compressibility. Venous Reflux:  None. Other Findings:  None. IMPRESSION: No evidence of deep venous thrombosis. Electronically Signed   By: Alcide Clever M.D.   On: 06/22/2020 17:05    Procedures Procedures (including critical care time)  Medications Ordered in ED Medications - No data to display  ED Course  I have reviewed the triage vital signs and the nursing notes.  Pertinent labs & imaging results that were available during my care of the patient were reviewed by me and considered in my medical decision making (see chart for details).    MDM Rules/Calculators/A&P                         24 year old female who is [redacted] weeks pregnant presents with 5 days of worsening medial left thigh pain.  Differential diagnosis for this patient symptoms include to DVT, superficial venous thrombosis, thrombophlebitis muscular strain.    Vital signs normal on  intake.  Physical exam significant for left medial thigh tenderness to palpation, spasm, without other abnormality.  Small area of ecchymosis over left distal thigh.  Given pain that is worse with movement, favor diagnosis of muscular strain.  Venous Doppler ultrasound of the left lower extremity was negative for DVT.  Case discussed with attending physician Dr. Judd Lien, favor diagnosis of muscular strain, given physical exam with muscle spasm, pain with movement..  Given reassuring physical exam and imaging studies, as well as vital signs -no further work-up is indicated in the emergency department at this time.  Recommend Tylenol as needed, ice to the area, elevation, rest.   Recommend patient follow-up with her primary care doctor, OB/GYN.  Christina Holt voiced understanding of her medical evaluation and treatment plan.  Each of her questions were answered to her expressed satisfaction.  Return precautions were given.  Patient is well-appearing and is stable for discharge.  Final Clinical Impression(s) / ED Diagnoses Final diagnoses:  Muscle strain    Rx / DC Orders ED Discharge Orders    None       Sherrilee Gilles 06/22/20 2154    Geoffery Lyons, MD 06/23/20 [phone]

## 2020-06-22 NOTE — Discharge Instructions (Signed)
You were evaluated in the emergency department today for your left leg pain.  Your physical exam and vital signs were very reassuring.  Ultrasound of your left leg did not show any blood clots, or changes in the blood vessels in your leg.  This is very reassuring.  It is likely that the pain you are experiencing is secondary to muscle strain and spasm, where the muscle is irritated and inappropriately tightened up.  You may have muscle spasm in your shoulders on both sides.  You may take hot showers, or drape a hot towel of your shoulders prior to massaging the area to help relax the muscles.  You may take Tylenol as needed for your pain.    You may also be experiencing mild thrombophlebitis, which is inflammation of the blood vessels in the leg.  This is self resolving.  You may apply ice to the area for 10 to 15 minutes at a time a few times a day as needed as well.  Please follow-up with your OB/GYN if your symptoms worsen or fail to improve.  Return to the emergency department if you develop any swelling in your left leg, weakness in that leg, or worsening tingling/numbness.

## 2020-06-22 NOTE — ED Triage Notes (Signed)
Pt was seen at [facility] prior to arrival, has been having pain to left thigh for 5 days. States she has an area that looks like a "blood collection" to leg. Sent by [facility] for DVT r/o. Tingling to right foot, pulses intact. [redacted] weeks pregnant.

## 2020-06-23 ENCOUNTER — Encounter (HOSPITAL_COMMUNITY): Payer: Self-pay | Admitting: Emergency Medicine

## 2022-03-24 IMAGING — US US EXTREM LOW VENOUS*L*
1 series · 13 of 24 positions shown · non-contrast
Comparison: None.

CLINICAL DATA: Left leg pain for several days, early pregnancy



[Series 1: us extrem low venous*left* · 13 of 33 slices shown]
[im 1/33]
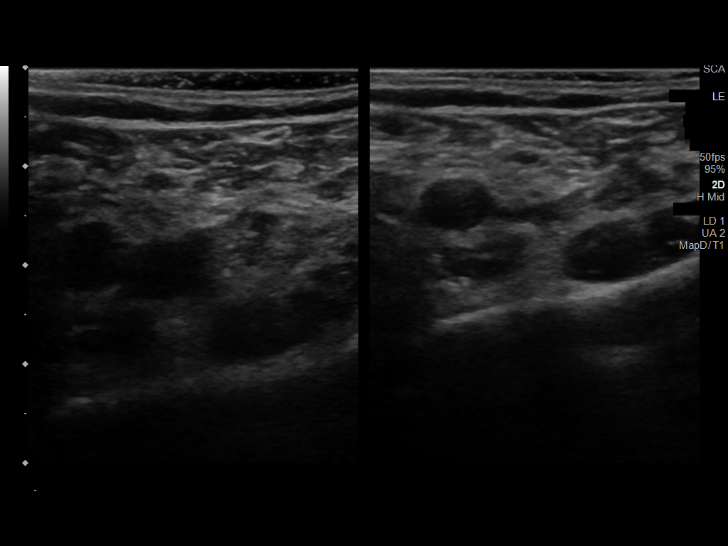
[im 3/33]
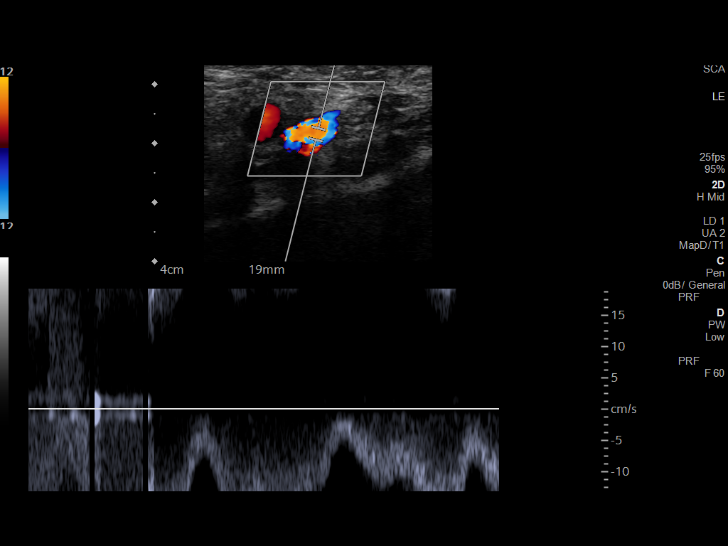
[im 6/33]
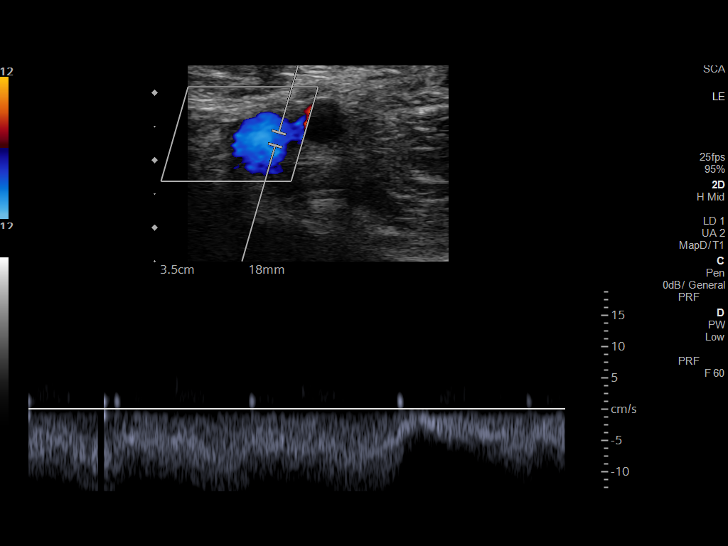
[im 9/33]
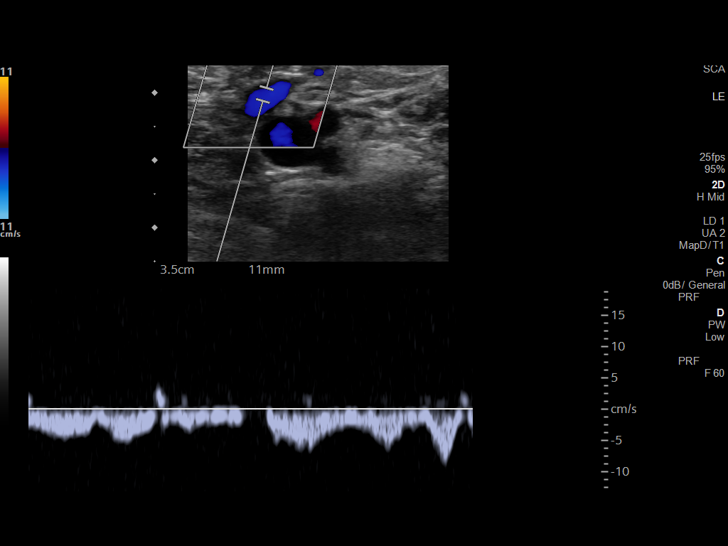
[im 12/33]
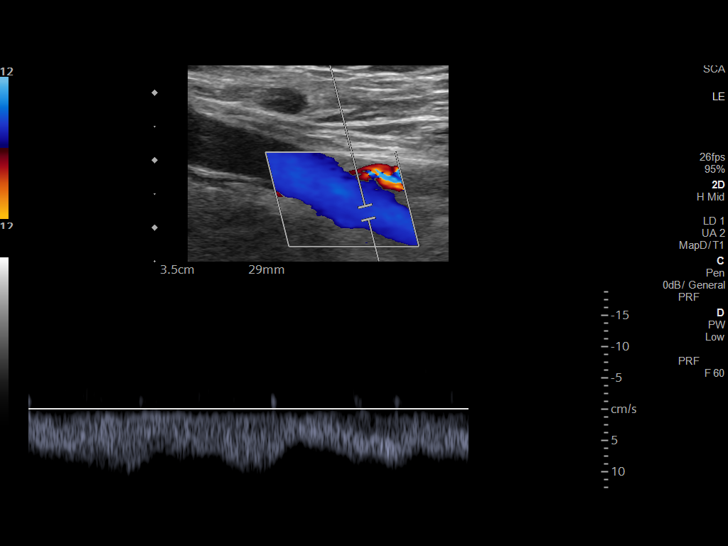
[im 14/33]
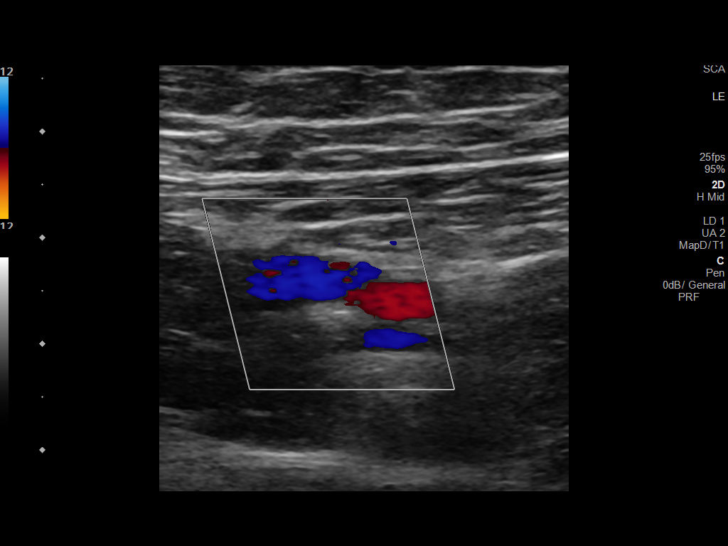
[im 17/33]
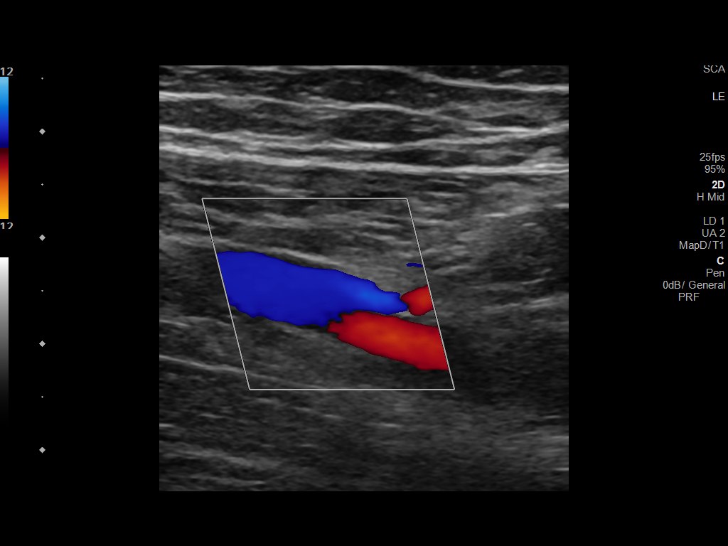
[im 19/33]
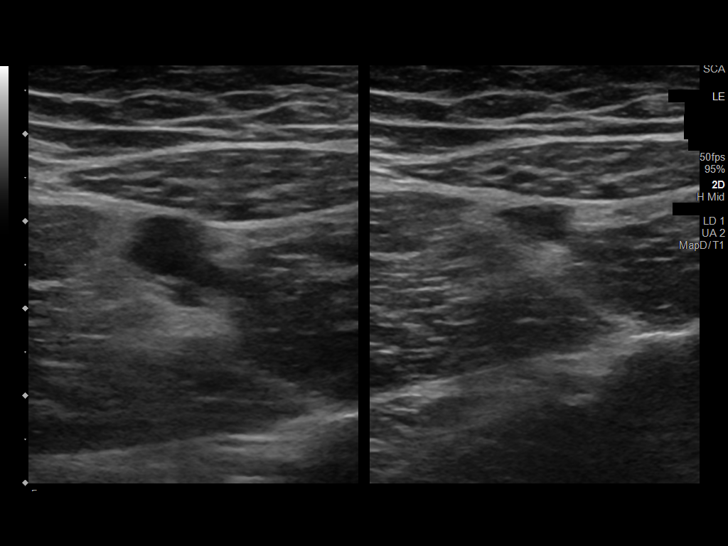
[im 21/33]
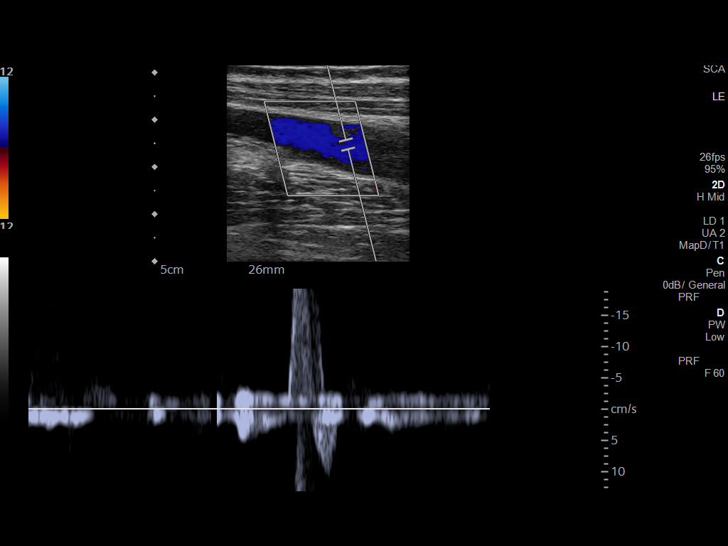
[im 24/33]
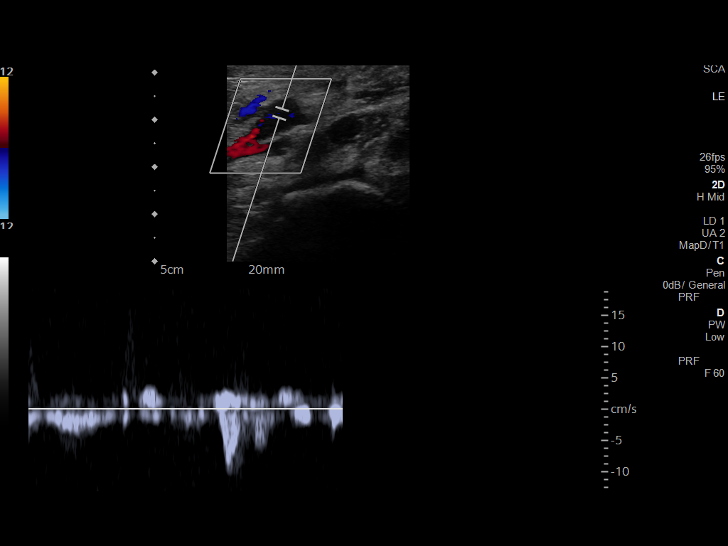
[im 27/33]
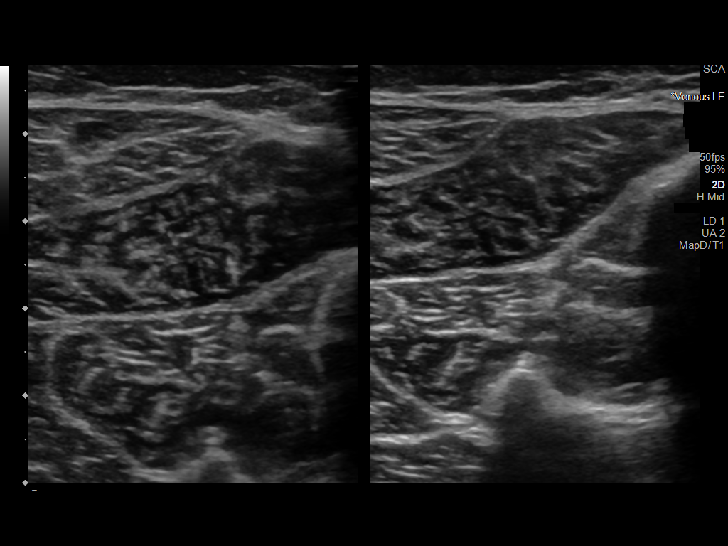
[im 30/33]
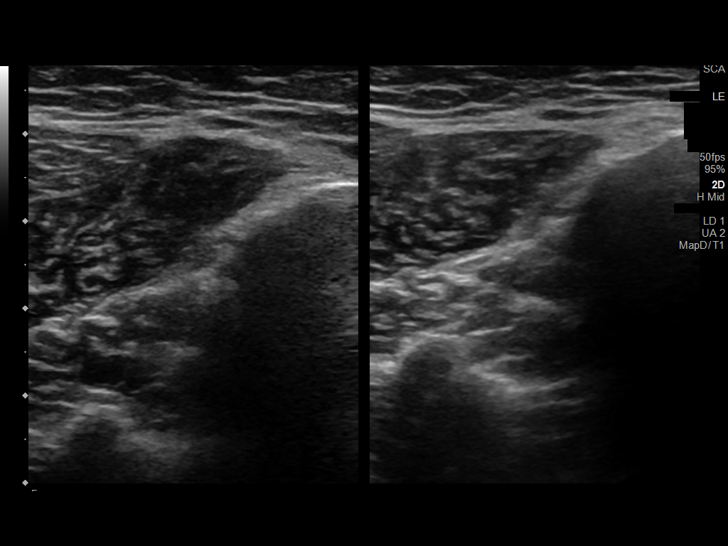
[im 33/33]
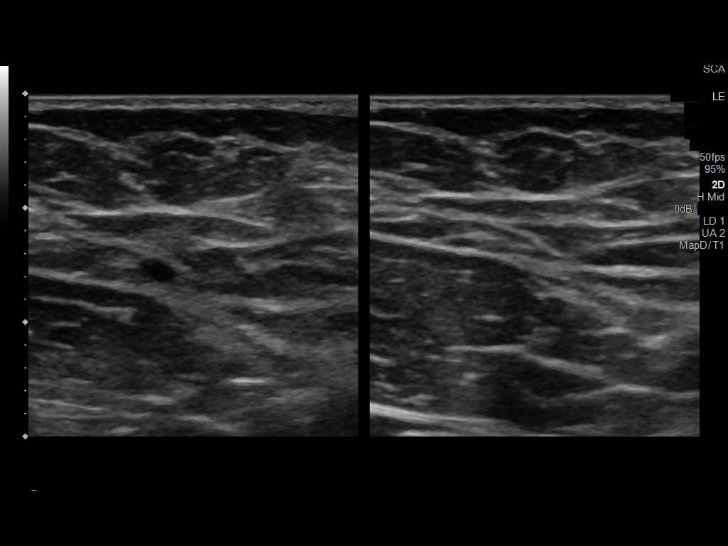

[13 of 24 positions shown; findings below may reference images not displayed]

FINDINGS: Contralateral Common Femoral Vein: Respiratory phasicity is normal
and symmetric with the symptomatic side. No evidence of thrombus.
Normal compressibility.

Common Femoral Vein: No evidence of thrombus. Normal
compressibility, respiratory phasicity and response to augmentation.

Saphenofemoral Junction: No evidence of thrombus. Normal
compressibility and flow on color Doppler imaging.

Profunda Femoral Vein: No evidence of thrombus. Normal
compressibility and flow on color Doppler imaging.

Femoral Vein: No evidence of thrombus. Normal compressibility,
respiratory phasicity and response to augmentation.

Popliteal Vein: No evidence of thrombus. Normal compressibility,
respiratory phasicity and response to augmentation.

Calf Veins: No evidence of thrombus. Normal compressibility and flow
on color Doppler imaging.

Superficial Great Saphenous Vein: No evidence of thrombus. Normal
compressibility.

Venous Reflux:  None.

Other Findings:  None.
IMPRESSION: No evidence of deep venous thrombosis.
